# Patient Record
Sex: Female | Born: 1950 | Race: Black or African American | Hispanic: No | Marital: Married
Health system: Southern US, Community
[De-identification: ages and names within clinical notes are randomized; demographics above are authoritative.]

---

## 1998-06-06 ENCOUNTER — Ambulatory Visit (HOSPITAL_COMMUNITY): Admission: RE | Admit: 1998-06-06 | Discharge: 1998-06-06 | Payer: Self-pay | Admitting: *Deleted

## 1998-06-06 ENCOUNTER — Encounter: Payer: Self-pay | Admitting: *Deleted

## 1999-01-07 ENCOUNTER — Other Ambulatory Visit: Admission: RE | Admit: 1999-01-07 | Discharge: 1999-01-07 | Payer: Self-pay | Admitting: Obstetrics & Gynecology

## 1999-06-11 ENCOUNTER — Ambulatory Visit (HOSPITAL_COMMUNITY): Admission: RE | Admit: 1999-06-11 | Discharge: 1999-06-11 | Payer: Self-pay | Admitting: Obstetrics & Gynecology

## 1999-06-11 ENCOUNTER — Encounter: Payer: Self-pay | Admitting: Obstetrics & Gynecology

## 2000-01-22 ENCOUNTER — Ambulatory Visit (HOSPITAL_COMMUNITY): Admission: RE | Admit: 2000-01-22 | Discharge: 2000-01-22 | Payer: Self-pay | Admitting: Internal Medicine

## 2000-01-23 ENCOUNTER — Encounter: Payer: Self-pay | Admitting: Internal Medicine

## 2000-02-26 ENCOUNTER — Other Ambulatory Visit: Admission: RE | Admit: 2000-02-26 | Discharge: 2000-02-26 | Payer: Self-pay | Admitting: Obstetrics & Gynecology

## 2000-03-05 ENCOUNTER — Encounter: Payer: Self-pay | Admitting: Internal Medicine

## 2000-03-05 ENCOUNTER — Encounter (INDEPENDENT_AMBULATORY_CARE_PROVIDER_SITE_OTHER): Payer: Self-pay | Admitting: *Deleted

## 2000-03-05 ENCOUNTER — Ambulatory Visit (HOSPITAL_COMMUNITY): Admission: RE | Admit: 2000-03-05 | Discharge: 2000-03-05 | Payer: Self-pay | Admitting: Internal Medicine

## 2000-06-30 ENCOUNTER — Ambulatory Visit (HOSPITAL_COMMUNITY): Admission: RE | Admit: 2000-06-30 | Discharge: 2000-06-30 | Payer: Self-pay | Admitting: Obstetrics & Gynecology

## 2000-06-30 ENCOUNTER — Encounter: Payer: Self-pay | Admitting: Obstetrics & Gynecology

## 2000-07-07 ENCOUNTER — Encounter: Admission: RE | Admit: 2000-07-07 | Discharge: 2000-07-07 | Payer: Self-pay | Admitting: Obstetrics & Gynecology

## 2000-07-07 ENCOUNTER — Encounter: Payer: Self-pay | Admitting: Obstetrics & Gynecology

## 2001-03-01 ENCOUNTER — Other Ambulatory Visit: Admission: RE | Admit: 2001-03-01 | Discharge: 2001-03-01 | Payer: Self-pay | Admitting: Obstetrics & Gynecology

## 2001-03-10 ENCOUNTER — Encounter: Payer: Self-pay | Admitting: Obstetrics & Gynecology

## 2001-03-10 ENCOUNTER — Encounter: Admission: RE | Admit: 2001-03-10 | Discharge: 2001-03-10 | Payer: Self-pay | Admitting: Obstetrics & Gynecology

## 2001-08-02 ENCOUNTER — Encounter: Admission: RE | Admit: 2001-08-02 | Discharge: 2001-08-02 | Payer: Self-pay | Admitting: Urology

## 2001-08-02 ENCOUNTER — Encounter: Payer: Self-pay | Admitting: Urology

## 2002-03-15 ENCOUNTER — Other Ambulatory Visit: Admission: RE | Admit: 2002-03-15 | Discharge: 2002-03-15 | Payer: Self-pay | Admitting: Obstetrics & Gynecology

## 2002-03-15 ENCOUNTER — Encounter: Payer: Self-pay | Admitting: Obstetrics & Gynecology

## 2002-03-15 ENCOUNTER — Encounter: Admission: RE | Admit: 2002-03-15 | Discharge: 2002-03-15 | Payer: Self-pay | Admitting: Obstetrics & Gynecology

## 2002-05-26 ENCOUNTER — Ambulatory Visit (HOSPITAL_COMMUNITY): Admission: RE | Admit: 2002-05-26 | Discharge: 2002-05-26 | Payer: Self-pay | Admitting: Endocrinology

## 2002-05-26 ENCOUNTER — Encounter: Payer: Self-pay | Admitting: Endocrinology

## 2002-12-13 ENCOUNTER — Encounter: Payer: Self-pay | Admitting: Endocrinology

## 2002-12-13 ENCOUNTER — Ambulatory Visit (HOSPITAL_COMMUNITY): Admission: RE | Admit: 2002-12-13 | Discharge: 2002-12-13 | Payer: Self-pay | Admitting: Endocrinology

## 2003-03-20 ENCOUNTER — Encounter: Admission: RE | Admit: 2003-03-20 | Discharge: 2003-03-20 | Payer: Self-pay | Admitting: Obstetrics & Gynecology

## 2003-03-24 ENCOUNTER — Encounter: Admission: RE | Admit: 2003-03-24 | Discharge: 2003-03-24 | Payer: Self-pay | Admitting: Family Medicine

## 2003-06-16 ENCOUNTER — Ambulatory Visit (HOSPITAL_COMMUNITY): Admission: RE | Admit: 2003-06-16 | Discharge: 2003-06-16 | Payer: Self-pay | Admitting: Endocrinology

## 2003-09-21 ENCOUNTER — Encounter (INDEPENDENT_AMBULATORY_CARE_PROVIDER_SITE_OTHER): Payer: Self-pay | Admitting: Specialist

## 2003-09-21 ENCOUNTER — Ambulatory Visit (HOSPITAL_COMMUNITY): Admission: RE | Admit: 2003-09-21 | Discharge: 2003-09-21 | Payer: Self-pay | Admitting: Endocrinology

## 2003-10-26 ENCOUNTER — Ambulatory Visit (HOSPITAL_COMMUNITY): Admission: RE | Admit: 2003-10-26 | Discharge: 2003-10-26 | Payer: Self-pay | Admitting: Internal Medicine

## 2004-03-27 ENCOUNTER — Encounter: Admission: RE | Admit: 2004-03-27 | Discharge: 2004-03-27 | Payer: Self-pay | Admitting: Obstetrics & Gynecology

## 2004-04-29 ENCOUNTER — Ambulatory Visit (HOSPITAL_COMMUNITY): Admission: RE | Admit: 2004-04-29 | Discharge: 2004-04-29 | Payer: Self-pay | Admitting: General Surgery

## 2004-09-16 ENCOUNTER — Ambulatory Visit (HOSPITAL_COMMUNITY): Admission: RE | Admit: 2004-09-16 | Discharge: 2004-09-16 | Payer: Self-pay | Admitting: Endocrinology

## 2005-04-03 ENCOUNTER — Encounter: Admission: RE | Admit: 2005-04-03 | Discharge: 2005-04-03 | Payer: Self-pay | Admitting: Obstetrics & Gynecology

## 2005-05-02 ENCOUNTER — Ambulatory Visit (HOSPITAL_COMMUNITY): Admission: RE | Admit: 2005-05-02 | Discharge: 2005-05-02 | Payer: Self-pay | Admitting: Endocrinology

## 2005-05-23 ENCOUNTER — Inpatient Hospital Stay (HOSPITAL_COMMUNITY): Admission: EM | Admit: 2005-05-23 | Discharge: 2005-05-26 | Payer: Self-pay | Admitting: Emergency Medicine

## 2006-03-03 ENCOUNTER — Ambulatory Visit (HOSPITAL_COMMUNITY): Admission: RE | Admit: 2006-03-03 | Discharge: 2006-03-03 | Payer: Self-pay | Admitting: Endocrinology

## 2006-04-20 ENCOUNTER — Encounter: Admission: RE | Admit: 2006-04-20 | Discharge: 2006-04-20 | Payer: Self-pay | Admitting: Obstetrics & Gynecology

## 2007-04-05 ENCOUNTER — Ambulatory Visit (HOSPITAL_COMMUNITY): Admission: RE | Admit: 2007-04-05 | Discharge: 2007-04-05 | Payer: Self-pay | Admitting: Endocrinology

## 2007-04-27 ENCOUNTER — Encounter: Admission: RE | Admit: 2007-04-27 | Discharge: 2007-04-27 | Payer: Self-pay | Admitting: Obstetrics & Gynecology

## 2007-06-02 ENCOUNTER — Ambulatory Visit (HOSPITAL_COMMUNITY): Admission: RE | Admit: 2007-06-02 | Discharge: 2007-06-02 | Payer: Self-pay | Admitting: Endocrinology

## 2007-06-02 ENCOUNTER — Encounter (INDEPENDENT_AMBULATORY_CARE_PROVIDER_SITE_OTHER): Payer: Self-pay | Admitting: Diagnostic Radiology

## 2008-03-03 ENCOUNTER — Ambulatory Visit (HOSPITAL_COMMUNITY): Admission: RE | Admit: 2008-03-03 | Discharge: 2008-03-03 | Payer: Self-pay | Admitting: Oncology

## 2008-04-27 ENCOUNTER — Encounter: Admission: RE | Admit: 2008-04-27 | Discharge: 2008-04-27 | Payer: Self-pay | Admitting: Obstetrics & Gynecology

## 2008-09-12 ENCOUNTER — Ambulatory Visit (HOSPITAL_COMMUNITY): Admission: RE | Admit: 2008-09-12 | Discharge: 2008-09-12 | Payer: Self-pay | Admitting: Endocrinology

## 2009-05-01 ENCOUNTER — Encounter: Admission: RE | Admit: 2009-05-01 | Discharge: 2009-05-01 | Payer: Self-pay | Admitting: Obstetrics & Gynecology

## 2009-10-26 ENCOUNTER — Ambulatory Visit (HOSPITAL_COMMUNITY): Admission: RE | Admit: 2009-10-26 | Discharge: 2009-10-26 | Payer: Self-pay | Admitting: Endocrinology

## 2009-11-14 ENCOUNTER — Ambulatory Visit (HOSPITAL_COMMUNITY): Admission: RE | Admit: 2009-11-14 | Discharge: 2009-11-14 | Payer: Self-pay | Admitting: Internal Medicine

## 2010-04-28 ENCOUNTER — Encounter: Payer: Self-pay | Admitting: Endocrinology

## 2010-05-03 ENCOUNTER — Encounter
Admission: RE | Admit: 2010-05-03 | Discharge: 2010-05-03 | Payer: Self-pay | Source: Home / Self Care | Attending: Obstetrics & Gynecology | Admitting: Obstetrics & Gynecology

## 2010-07-22 ENCOUNTER — Ambulatory Visit
Admission: RE | Admit: 2010-07-22 | Discharge: 2010-07-22 | Disposition: A | Discharge status: Home / Self Care | Payer: BC Managed Care – PPO | Source: Ambulatory Visit | Attending: Family Medicine | Admitting: Family Medicine

## 2010-07-22 ENCOUNTER — Other Ambulatory Visit: Payer: Self-pay | Admitting: Family Medicine

## 2010-07-22 DIAGNOSIS — R52 Pain, unspecified: Secondary | ICD-10-CM

## 2010-08-23 NOTE — H&P (Signed)
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  In general, she is in good health. She reports that she is usually  physically active, trying to walk about 20 minutes each day.  Generally, she  has no trouble doing this.  She also notes that in July of 2006, she was  found to have evidence of hyperlipidemia. She was placed on Lipitor, but did  not take this medication after a few days because she thought it caused  arthralgias.  The patient indicates that yesterday she walked from one  building on campus to another.  She says that the distance was approximately  a half a mile.  Before she got to the other building, she began to  experience some substernal burning or aching chest discomfort which was  nonradiating and associated with mild dyspnea. She stopped and rested and  the pain seemed to resolve.  When she resumed walking, she said the pain  came back but was not as severe. There was no associated diaphoresis or  nausea.  When she arrived at her destination, she attended a meeting and had  no difficulty during that time.  Later in the day, she walked up a flight of  stairs and experienced recurrence of substernal burning which she described  as different that anything she had ever experienced before.  This morning,  she called Dr. Nash Ayala office and talked to [facility].  The [facility] told  her to come to the [facility] emergency room.  On arrival to [facility]  emergency room, her blood pressure was 157/91, pulse was 70,  O2 sat 100%.  Other information which is available so far includes an electrocardiogram  which shows normal sinus rhythm with no ischemia, arrhythmia or hypertrophy,  and a chest x-ray which is suggestive of borderline cardiomegaly.  The  patient is admitted for evaluation of exertional chest pain.   PAST MEDICAL HISTORY:   MEDICATIONS:  The only medication she is taking at present is Actonel.   ALLERGIES:  She is not allergic to any medications.   OPERATIONS:  1.  She had a TAH-BSO in the past.  2.  She also has had a biopsy of a thyroid nodule. This revealed only benign      tissue.   MEDICAL ILLNESSES:  1.  Hyperlipidemia. Please see above.  2.  Thyroid nodule. The patient has been followed on an ongoing basis for      evaluation of a thyroid nodule.  On the last CAT scan that was obtained,      this nodule has not enlarged in  size.  I believe the plan is going to be      to continue to follow it expectantly.   FAMILY HISTORY:  Her mother died of a stroke at age 18. She really does not  know too much about the details of this illness.  Her father died apparently  of old age at age 49. She does not have any siblings. She also does not have  any children.   SOCIAL HISTORY:  She is married.  Her husband has diabetes.  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 She discontinued cigarette smoking in 1983.  She does  not abuse alcohol or drugs.   REVIEW OF SYSTEMS:  HEAD:  She denies headache or dizziness. EYES:  She  denies visual blurring or diplopia.  EARS, NOSE, THROAT:  Denies earache,  sinus pain or sore throat. CHEST:  Denies coughing or wheezing.  CARDIOVASCULAR:  See above. There has been no orthopnea or PND. Note that  the patient was evaluated by [facility] in about 2003 with a Cardiolite  study that was normal. GI: She denies nausea or vomiting. She did have some  acid reflux symptoms when she first  started Actonel, but she has not had any  problems like this for many months. There has been no hematemesis or melena.  GU: She denies dysuria or urinary frequency. NEUROLOGIC:  There is no  history of seizure or stroke.  ENDOCRINE:  Denies excessive thirst, urinary  frequency or nocturia.   PHYSICAL EXAMINATION:  GENERAL:  She is a very pleasant cooperative lady.  VITAL SIGNS:  Temperature is 97.2, blood pressure initially was 157/91,  pulse 70, respirations 18, O2 saturation was 100%. HEENT:  Pupils were equal  and reactive. Tympanic membranes were clear. Nares were patent. Pharynx is  without erythema or exudate.  NECK:  A thyroid nodule was easily palpable.  It is smooth, somewhat soft.  It is nontender. It is perhaps 1.5 cm in diameter.  CHEST:  Clear.  BACK:  No CVA or point tenderness.  CARDIOVASCULAR:  Normal S1 and S2.  There are no rubs, murmurs or gallops.  ABDOMEN:  Benign.  NEUROLOGIC:  Neurologic testing and examination of the extremities was  normal.   IMPRESSION:  1.  Exertional chest pain; consider cardiac etiology.  2.  Osteoporosis.  3.  History of hyperlipidemia.  4.  Family history of stroke affecting mother at age 66.  5.  Thyroid nodule felt to be benign.  6.  Status post hysterectomy.   PLAN:  Will admit the patient to rule out MI.  Will obtain a lipid profile  to check the status of this problem.  A cardiology consult will be  requested.           ______________________________  Sherin Quarry, MD     SY/MEDQ  D:  05/23/2005  T:  05/23/2005  Job:  [id number]   cc:   [facility], M.D.  Fax: [phone]

## 2010-08-23 NOTE — Discharge Summary (Signed)
NAME:  Joy [facility]ala, [facility]         ACCOUNT NO.:  [id number]   MEDICAL RECORD NO.:  [id number]          PATIENT TYPE:  INP   LOCATION:  1402                         FACILITY:  [facility]   PHYSICIAN:  Hollice Espy, M.D.DATE OF BIRTH:  07/07/1950   DATE OF ADMISSION:  05/23/2005  DATE OF DISCHARGE:  05/26/2005                                 DISCHARGE SUMMARY   CONSULTATIONS:  1.  Viann Fish, M.D., cardiology.  2.  Graylin Shiver, M.D., [facility].   PRIMARY CARE PHYSICIAN:  Francis P. Modesto Charon, M.D.   DISCHARGE DIAGNOSES:  1.  A 7 mm duodenal ulcer.  2.  Upper gastrointestinal bleed, now resolved.  3.  Status post EGD done February 17.  4.  Anemia secondary to GI bleed status post blood transfusion.   DISCHARGE MEDICATIONS:  The patient will continue her previous medication of  Boniva. She will be started on Protonix 40 mg p.o. b.i.d. x30 days and then  daily.   FOLLOW UP:  The patient will follow up with Dr. Modesto Charon in the next 7 days. At  that time, followup on her H. pylori test will be done.   DIET:  Discharge diet is regular diet.   ACTIVITY:  As tolerated.   DISPOSITION:  Improved.   HISTORY OF PRESENT ILLNESS:  The patient is a 60 year old African-American  female with essentially no past medical history who presented with  complaints of weakness, chest burning and discomfort on May 23, 2005.  Initially she was evaluated for chest pain rule out cardiac etiology  however, on admission she was noted to have a hemoglobin of 8.4. She had no  previous history of anemia. Overnight her hemoglobin dropped to 7.5 and she  had a melanotic stool episode. At that time, it was suspected that perhaps  the patient had a bleeding ulcer. Dr. Donnie Aho from cardiology evaluated the  patient and likely suspected that she had an upper GI bleed and likely an  ulceration may be cause of this  mid epigastric discomfort. It is possible  that with a low enough hemoglobin, this could be  leading to a cardiac  etiology as well. The patient was transfused 2 units and [facility] was  consulted. Dr. Evette Cristal saw the patient and performed an upper endoscopy on the  patient showing a 7 mm duodenal ulcer. No visible vessel. The patient had  been started on IV PPI, this was continued and she has continued to be  observed. By May 26, 2004, she was doing well, tolerating p.o. She had  a brief episode of melanotic stool on February 18 but by February 19, her  hemoglobin had increased to 11.4, she had no further episodes of GI bleeding  and it is felt to be stable for her to be  discharged home on a PPI. An H. pylori blood test was ordered and the plan  will be for the patient to followup with Dr. Modesto Charon and have the blood test  checked at that time. In the meantime, she is avoid aspirin and all types of  NSAIDs. The patient understands these things and we discussed this in  full  detail.      Hollice Espy, M.D.  Electronically Signed     SKK/MEDQ  D:  05/26/2005  T:  05/26/2005  Job:  [id number]   cc:   Graylin Shiver, M.D.  Fax: [phone]   Maryla Morrow. Modesto Charon, M.D.  Fax: [phone]   W. Viann Fish, M.D.  Fax: [phone]  Email: [email or web address]

## 2010-08-23 NOTE — H&P (Signed)
NAME:  Joy Ayala, [name]         ACCOUNT NO.:  [id number]   MEDICAL RECORD NO.:  [id number]          PATIENT TYPE:  INP   LOCATION:  1402                         FACILITY:  [facility]   PHYSICIAN:  Sherin Quarry, MD      DATE OF BIRTH:  09-02-1950   DATE OF ADMISSION:  05/23/2005  DATE OF DISCHARGE:                                HISTORY & PHYSICAL   ADDENDUM:  Subsequent to my previous dictation, it was reported that the  patient's CBC shows a white count of 7,400, hemoglobin of 8.4 with  normochromic normocytic indices and a platelet count 203,000. I discussed  this with the patient. She indicates that she thinks that she last had a CBC  drawn in November of last year and that it was apparently normal. She has  had no history of hematemesis, melena or bleeding. She takes a multiple  vitamin, fish oil and garlic every day as well as her Actonel. There is no  previous history of anemia.   With [name]'s history of chest pain, I advised her that we should  draw appropriate lab studies and then give her 2 units of packed cells,  keeping her hemoglobin greater than 9 in order to ensure adequate oxygen-  carrying capacity. After a careful discussion, she categorically and  emphatically refuses blood transfusion. Therefore, I will obtain a  reticulocyte count, ferritin, iron TIBC, folate, B12 and heme stools x2.  Will follow her CBC and discuss possible transfusion if it continues to  drop. I believe the patient understands this plan.           ______________________________  Sherin Quarry, MD     SY/MEDQ  D:  05/23/2005  T:  05/24/2005  Job:  [id number]

## 2010-08-23 NOTE — Op Note (Signed)
NAME:  Joy Ayala, Joy Ayala         ACCOUNT NO.:  [id number]   MEDICAL RECORD NO.:  [id number]          PATIENT TYPE:  INP   LOCATION:  1402                         FACILITY:  [facility]   PHYSICIAN:  Graylin Shiver, M.D.   DATE OF BIRTH:  06-01-1950   DATE OF PROCEDURE:  05/25/2005  DATE OF DISCHARGE:                                 OPERATIVE REPORT   PROCEDURE:  Esophagogastroduodenoscopy.   INDICATIONS FOR PROCEDURE:  Melena, anemia.   Informed consent was obtained after explanation of the risks of bleeding,  infection and perforation.   PREMEDICATION:  Fentanyl 75 mcg IV, Versed 6 mg IV.   DESCRIPTION OF PROCEDURE:  With the patient in the left lateral decubitus  position, the Olympus gastroscope was inserted into the oropharynx and  passed into the esophagus. It was advanced down the esophagus and then into  the stomach and into the duodenum. The second portion of the duodenum looked  normal. In the distal duodenal bulb, the lumen looked scarred and somewhat  narrowed down. The area was also friable. I washed this area looking for a  specific lesion and did see a 7-mm duodenal ulcer. There was no obvious  visible vessel. There was friability however of the surrounding ulcer area  and scarred duodenal area. There was no evidence of tumor. The stomach  looked normal in its entirety. The esophagus looked normal in its entirety.  She tolerated the procedure well without complications.   IMPRESSION:  7-mm duodenal bulb ulcer with a scar deformed friable distal  bulb.   PLAN:  Protonix will be the treatment while here in the hospital. I would  recommend avoiding aspirin, nonsteroidal anti-inflammatory drugs and Lovenox  at this time. We will check a H. pylori antibody blood test.           ______________________________  Graylin Shiver, M.D.     SFG/MEDQ  D:  05/25/2005  T:  05/26/2005  Job:  [id number]   cc:   Sherin Quarry, MD

## 2010-08-23 NOTE — Consult Note (Signed)
NAME:  POET, HINEMAN         ACCOUNT NO.:  [id number]   MEDICAL RECORD NO.:  [id number]          PATIENT TYPE:  INP   LOCATION:  1402                         FACILITY:  [facility]   PHYSICIAN:  Meade Maw, M.D.    DATE OF BIRTH:  16-Jun-1950   DATE OF CONSULTATION:  DATE OF DISCHARGE:                                   CONSULTATION   INDICATION FOR CONSULTATION:  Chest pain.   HISTORY:  Joy [facility]ala is a very pleasant 60 year old female who I  originally evaluated in January, 2005 for chest pain.  At this time, she had  been noting ongoing epigastric chest pain for approximately three months.  The pain persisted for 10-20 minutes and was relieved with burping.  She  attributed the chest pain to Fosamax at that time.  She underwent a stress  Cardiolite.  The patient exercised for a total of six minutes.  There was no  evidence of inadequate coronary flow reserve.  She was noted to have normal  wall motion with an ejection fraction of 90%.  The patient has been doing  well subsequently.  She has been walking up and down the stairs and walking  10 minutes to her car.  She has had no chest pain with these activities.  Today, she noted a burning sensation in her chest, which was different from  her usual reflux pain.  The pain persisted for approximately 15 minutes.  The patient reached a place where she wanted to stop and rest, and the pain  gradually resolved.  There was no associated nausea, vomiting, or  diaphoresis with the chest pain.  Her coronary risk factors are significant  for age, remote history of tobacco use, stopped smoking now for 25 years  prior, dyslipidemia, no real family history.   PAST MEDICAL HISTORY:  1.  Osteoporosis.  2.  Thyroid nodule.  3.  Dyslipidemia.   PAST SURGICAL HISTORY:  Total abdominal hysterectomy with bilateral salpingo-  oophorectomy.   CURRENT MEDICATIONS:  Boniva.  She has been started on Lopressor 25 mg p.o.  q.12h.  Tylenol p.r.n.   Sublingual nitroglycerin.  Ativan p.r.n.  Ambien  p.r.n.  Pepcid 20 mg p.o. daily, per Dr. Tresa Endo.  She has also been started  on Lovenox with pharmacy to dose as well as aspirin.   She has no known drug allergies.   SOCIAL HISTORY:  She has recently married.  She works as a Production designer, theatre/television/film.  No  history of tobacco, alcohol, or illicit drug use.   FAMILY HISTORY:  Significant for hypertension.  Father's health history is  unknown.  Mother passed from alcohol abuse.   REVIEW OF SYSTEMS:  Otherwise negative.  No history of palpitations, tachy  arrhythmia, orthopnea, pedal edema.   PHYSICAL EXAMINATION:  VITAL SIGNS:  Blood pressure 157/91, heart rate 70.  Her respiratory rate is 18.  Her O2 sat is 100% on room air.  HEENT:  Unremarkable.  NECK:  She has good carotid upstrokes.  No carotid bruits are noted.  No  neck vein distention is noted.  RESPIRATORY:  Breath sounds which are clear and equal to auscultation.  No  use of accessory muscles.  CARDIOVASCULAR:  Regular rate and rhythm.  Normal S1.  Normal S2.  No rubs,  murmurs, or gallops noted.  ABDOMEN:  Benign.  Nontender.  EXTREMITIES:  Distal pulses which are palpable.  SKIN:  Warm and dry.  NEUROLOGIC:  Nonfocal.   LABORATORY DATA:  White count 7.4, hemoglobin 8.4, hematocrit 24.2.  Platelet count 203,000.  Sodium 139, potassium 3.9, CO2 26, BUN 11,  creatinine 0.8.   Chest x-ray reveals borderline cardiomegaly.  No acute disease.   ECG reveals a sinus rhythm.  There are borderline Q waves in the inferior  leads, which is new when compared with her ECG in 2005.  There are otherwise  nonspecific ST/T changes noted.   IMPRESSION:  77.  A 60 year old female with chest pain atypical for cardiac.  Her initial      point-of-care markers are negative.  The patient has  risk factors,      including age, dyslipidemia, hypertension, and questionable new Q waves      on her inferior leads.  We will therefore schedule the patient for       inpatient stress Cardiolite.  Agree with therapy as you have now with      Lopressor, Lovenox.  Will hold the Lopressor prior to her stress test as      well as the aspirin.  2.  Anemia:  This is a new problem.  I will defer it to her primary care      doctor for further evaluation and treatment.  3.  Dyslipidemia:  Agree with obtaining a lipid profile.  If LDL is more      than 130, she should be started on an anticholesterol medication.      Meade Maw, M.D.  Electronically Signed     HP/MEDQ  D:  05/23/2005  T:  05/23/2005  Job:  [id number]   cc:   Thelma Barge P. Modesto Charon, M.D.  Fax: [phone]

## 2010-08-23 NOTE — Consult Note (Signed)
NAME:  Joy [facility]ala, Joy Ayala         ACCOUNT NO.:  [id number]   MEDICAL RECORD NO.:  [id number]          PATIENT TYPE:  INP   LOCATION:  1402                         [facility]ACILITY:  [facility]   PHYSICIAN:  Graylin Shiver, M.D.   DATE OF BIRTH:  1951-03-01   DATE OF CONSULTATION:  05/24/2005  DATE OF DISCHARGE:                                   CONSULTATION   REASON FOR CONSULTATION:  The patient is a 60 year old female who was  admitted to the hospital on May 23, 2005 by Dr. Tresa Endo after the  patient presented to the emergency room with exertional chest pain. The  patient was seen in consultation by cardiology.   It was discovered that the patient was anemic and today the patient  experienced a melanotic stool. The patient has been having some heartburn  symptoms recently. She gives no history of vomiting, no history of peptic  ulcer disease. Her appetite has been good, her weight has been stable.   Her hemoglobin today was 7.5 and hematocrit 21. The melanotic stool was  checked and was Hemoccult positive. The patient denies taking aspirin. She  does take Actonel and Boniva.   PAST MEDICAL HISTORY:  Allergies, none known.   PAST SURGICAL HISTORY:  Abdominal hysterectomy, salpingo-oophorectomy,  biopsy of thyroid nodule.   MEDICAL PROBLEMS:  Hyperlipidemia, thyroid nodule.   FAMILY HISTORY:  Negative for colon cancer or colon polyps.   SOCIAL HISTORY:  Does not smoke cigarettes, does not drink excessive  alcohol.   REVIEW OF SYSTEMS:  No additional complaints other than above.   PHYSICAL EXAMINATION:  GENERAL:  She is in no distress.  VITAL SIGNS:  Stable.  NECK:  Supple.  HEART:  Regular rhythm, no murmurs.  LUNGS:  Clear.  ABDOMEN:  Soft, nontender, no hepatosplenomegaly.   IMPRESSION:  1.  Chest pain most likely caused by anemia.  2.  Anemia.  3.  Melena.  4.  Heartburn.   PLAN:  Will proceed with EGD to evaluate the upper GI tract and look for any  evidence of an  ulcer or significant other abnormalities in the upper GI  tract. The patient will be on Protonix. We will hold the Lovenox at this  time. She is scheduled to have blood transfusion.           ______________________________  Graylin Shiver, M.D.     SFG/MEDQ  D:  05/24/2005  T:  05/24/2005  Job:  [id number]   cc:   Sherin Quarry, MD   Maryla Morrow. Modesto Charon, M.D.  Fax: [phone]   [facility]

## 2010-10-02 ENCOUNTER — Other Ambulatory Visit: Payer: Self-pay | Admitting: Endocrinology

## 2010-10-02 DIAGNOSIS — E049 Nontoxic goiter, unspecified: Secondary | ICD-10-CM

## 2010-10-10 ENCOUNTER — Ambulatory Visit
Admission: RE | Admit: 2010-10-10 | Discharge: 2010-10-10 | Disposition: A | Discharge status: Home / Self Care | Payer: BC Managed Care – PPO | Source: Ambulatory Visit | Attending: Endocrinology | Admitting: Endocrinology

## 2010-10-10 DIAGNOSIS — E049 Nontoxic goiter, unspecified: Secondary | ICD-10-CM

## 2010-10-16 ENCOUNTER — Other Ambulatory Visit: Payer: Self-pay | Admitting: Endocrinology

## 2010-10-16 DIAGNOSIS — E049 Nontoxic goiter, unspecified: Secondary | ICD-10-CM

## 2010-12-27 LAB — CBC
Hemoglobin: 13.4
MCHC: 34.5
MCV: 90.4
RBC: 4.31

## 2011-04-14 ENCOUNTER — Ambulatory Visit
Admission: RE | Admit: 2011-04-14 | Discharge: 2011-04-14 | Disposition: A | Discharge status: Home / Self Care | Payer: BC Managed Care – PPO | Source: Ambulatory Visit | Attending: Endocrinology | Admitting: Endocrinology

## 2011-04-14 DIAGNOSIS — E049 Nontoxic goiter, unspecified: Secondary | ICD-10-CM

## 2011-05-06 ENCOUNTER — Other Ambulatory Visit: Payer: Self-pay | Admitting: Obstetrics & Gynecology

## 2011-05-06 DIAGNOSIS — Z1231 Encounter for screening mammogram for malignant neoplasm of breast: Secondary | ICD-10-CM

## 2011-05-16 ENCOUNTER — Ambulatory Visit
Admission: RE | Admit: 2011-05-16 | Discharge: 2011-05-16 | Disposition: A | Discharge status: Home / Self Care | Payer: BC Managed Care – PPO | Source: Ambulatory Visit | Attending: Obstetrics & Gynecology | Admitting: Obstetrics & Gynecology

## 2011-05-16 ENCOUNTER — Ambulatory Visit: Payer: BC Managed Care – PPO

## 2011-05-16 DIAGNOSIS — Z1231 Encounter for screening mammogram for malignant neoplasm of breast: Secondary | ICD-10-CM

## 2011-10-07 ENCOUNTER — Other Ambulatory Visit: Payer: Self-pay | Admitting: Endocrinology

## 2011-10-07 DIAGNOSIS — E049 Nontoxic goiter, unspecified: Secondary | ICD-10-CM

## 2011-10-16 ENCOUNTER — Ambulatory Visit
Admission: RE | Admit: 2011-10-16 | Discharge: 2011-10-16 | Disposition: A | Discharge status: Home / Self Care | Payer: BC Managed Care – PPO | Source: Ambulatory Visit | Attending: Endocrinology | Admitting: Endocrinology

## 2011-10-16 DIAGNOSIS — E049 Nontoxic goiter, unspecified: Secondary | ICD-10-CM

## 2012-04-30 ENCOUNTER — Other Ambulatory Visit: Payer: Self-pay | Admitting: Obstetrics & Gynecology

## 2012-04-30 DIAGNOSIS — Z1231 Encounter for screening mammogram for malignant neoplasm of breast: Secondary | ICD-10-CM

## 2012-06-01 ENCOUNTER — Ambulatory Visit
Admission: RE | Admit: 2012-06-01 | Discharge: 2012-06-01 | Disposition: A | Discharge status: Home / Self Care | Payer: BC Managed Care – PPO | Source: Ambulatory Visit | Attending: Obstetrics & Gynecology | Admitting: Obstetrics & Gynecology

## 2012-06-01 DIAGNOSIS — Z1231 Encounter for screening mammogram for malignant neoplasm of breast: Secondary | ICD-10-CM

## 2012-08-10 ENCOUNTER — Other Ambulatory Visit: Payer: Self-pay | Admitting: Family Medicine

## 2012-08-10 DIAGNOSIS — M542 Cervicalgia: Secondary | ICD-10-CM

## 2012-08-14 ENCOUNTER — Ambulatory Visit
Admission: RE | Admit: 2012-08-14 | Discharge: 2012-08-14 | Disposition: A | Discharge status: Home / Self Care | Payer: BC Managed Care – PPO | Source: Ambulatory Visit | Attending: Family Medicine | Admitting: Family Medicine

## 2012-08-14 DIAGNOSIS — M542 Cervicalgia: Secondary | ICD-10-CM

## 2013-01-03 ENCOUNTER — Other Ambulatory Visit: Payer: Self-pay | Admitting: Endocrinology

## 2013-01-03 DIAGNOSIS — E049 Nontoxic goiter, unspecified: Secondary | ICD-10-CM

## 2013-04-27 ENCOUNTER — Other Ambulatory Visit: Payer: Self-pay

## 2013-04-27 DIAGNOSIS — Z1231 Encounter for screening mammogram for malignant neoplasm of breast: Secondary | ICD-10-CM

## 2013-06-02 ENCOUNTER — Ambulatory Visit: Payer: BC Managed Care – PPO

## 2013-06-06 ENCOUNTER — Ambulatory Visit
Admission: RE | Admit: 2013-06-06 | Discharge: 2013-06-06 | Disposition: A | Discharge status: Home / Self Care | Payer: BC Managed Care – PPO | Source: Ambulatory Visit

## 2013-06-06 DIAGNOSIS — Z1231 Encounter for screening mammogram for malignant neoplasm of breast: Secondary | ICD-10-CM

## 2013-09-15 ENCOUNTER — Ambulatory Visit
Admission: RE | Admit: 2013-09-15 | Discharge: 2013-09-15 | Disposition: A | Discharge status: Home / Self Care | Payer: BC Managed Care – PPO | Source: Ambulatory Visit | Attending: Endocrinology | Admitting: Endocrinology

## 2013-09-15 DIAGNOSIS — E049 Nontoxic goiter, unspecified: Secondary | ICD-10-CM

## 2013-09-16 ENCOUNTER — Other Ambulatory Visit: Payer: BC Managed Care – PPO

## 2013-09-29 ENCOUNTER — Other Ambulatory Visit: Payer: BC Managed Care – PPO

## 2015-08-20 IMAGING — US US SOFT TISSUE HEAD/NECK
1 series · 13 of 25 positions shown · non-contrast
Comparison: Most recent prior thyroid ultrasound 10/16/2011

CLINICAL DATA: Thyroid goiter. Isthmic nodule previously biopsied
in Monday May, 2006 and Thursday November, 2009

EXAM:
THYROID ULTRASOUND
TECHNIQUE: Ultrasound examination of the thyroid gland and adjacent soft
tissues was performed.

[Series 1: us soft tissue head/neck · 0.06mm/px · 13 of 58 slices shown]
[im 1/58]
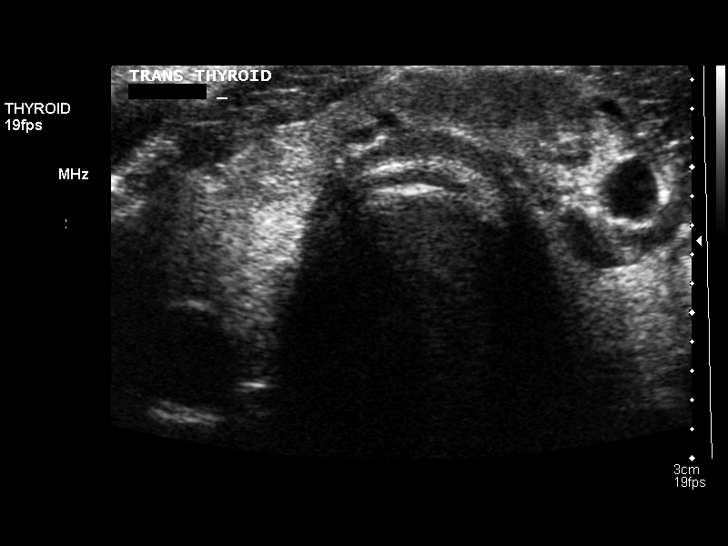
[im 5/58]
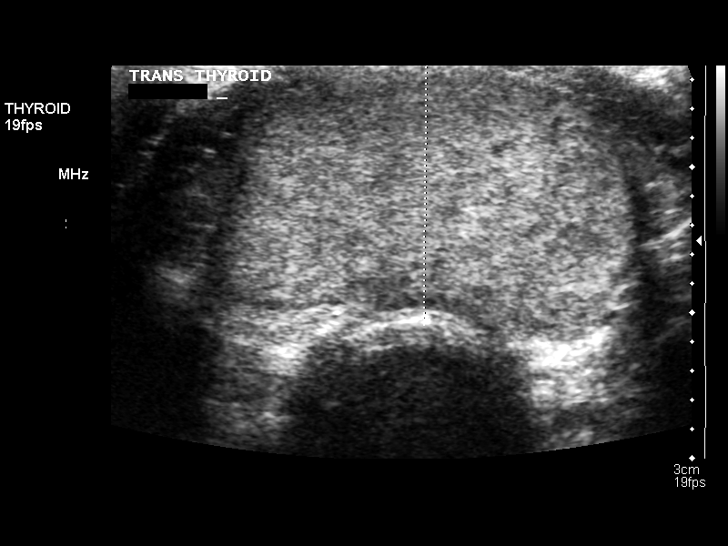
[im 10/58]
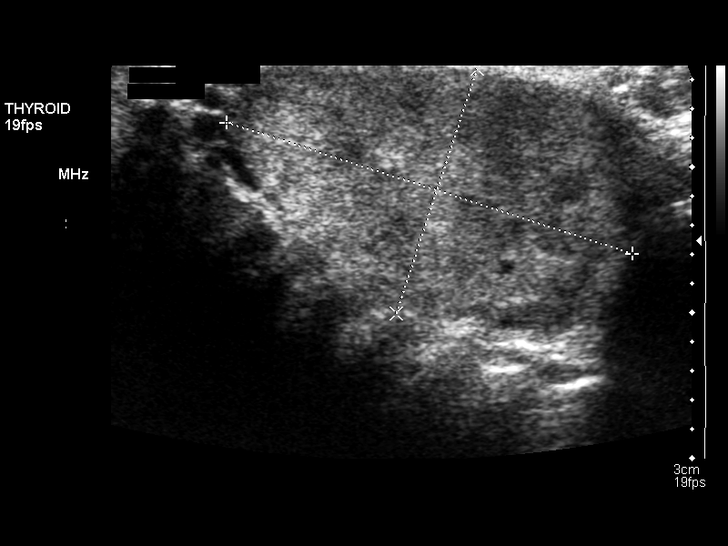
[im 15/58]
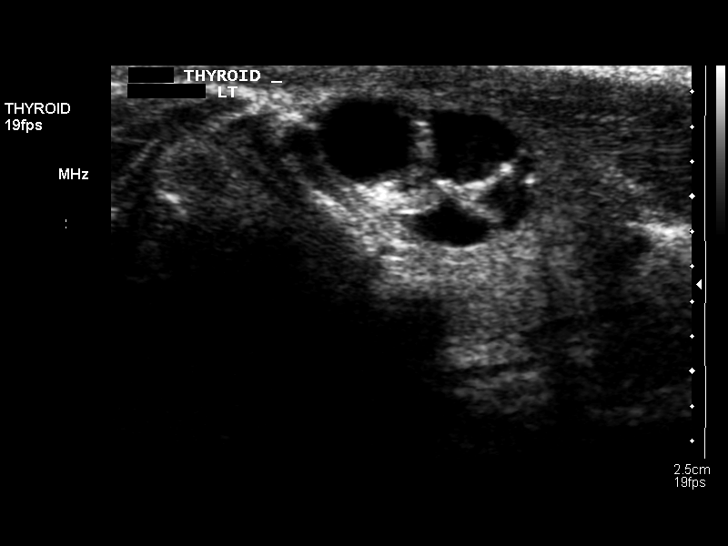
[im 20/58]
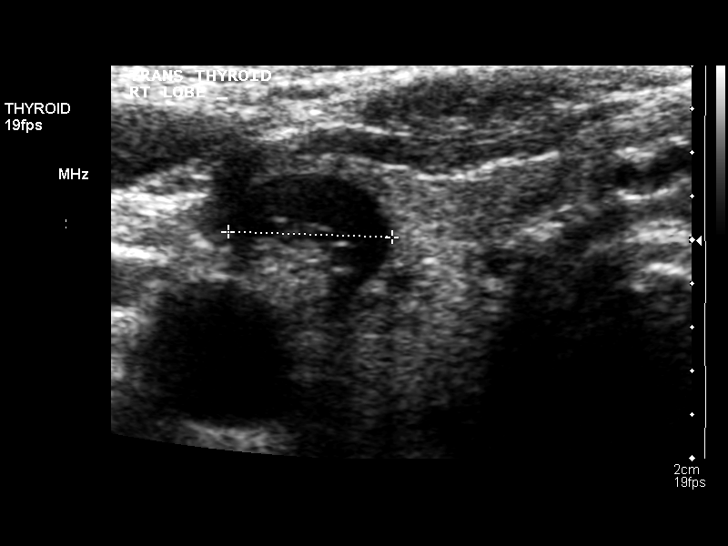
[im 24/58]
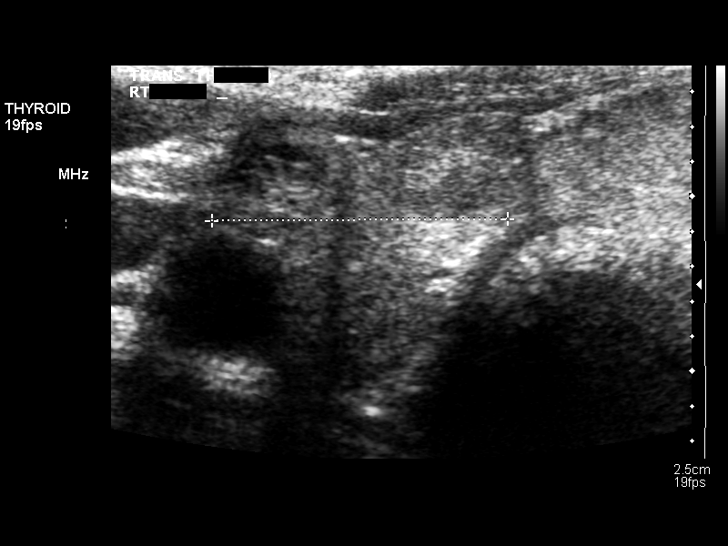
[im 29/58]
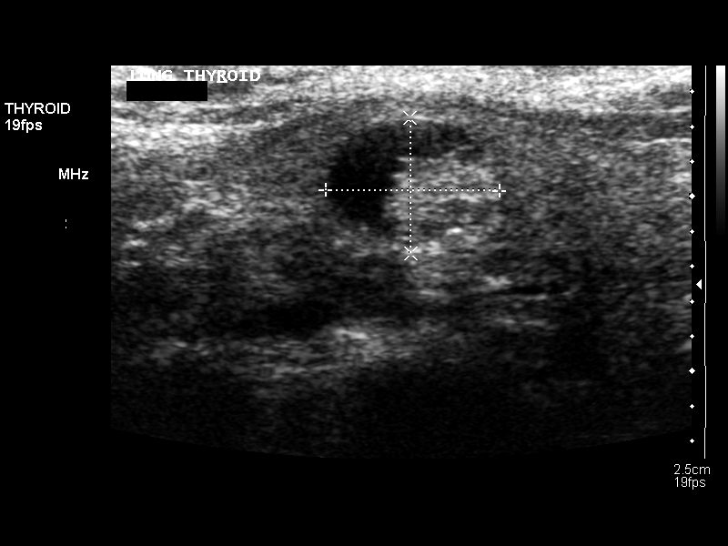
[im 34/58]
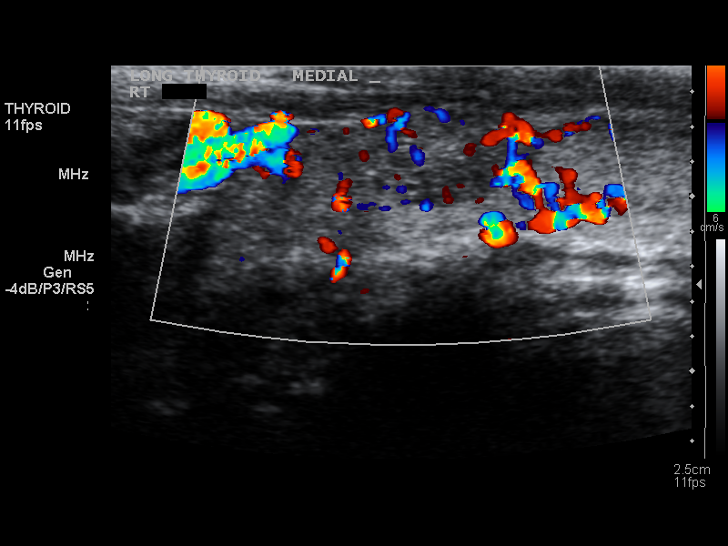
[im 39/58]
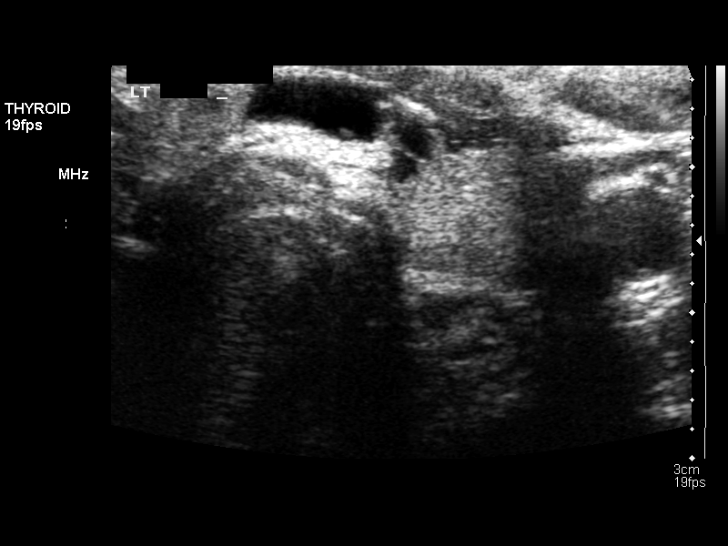
[im 43/58]
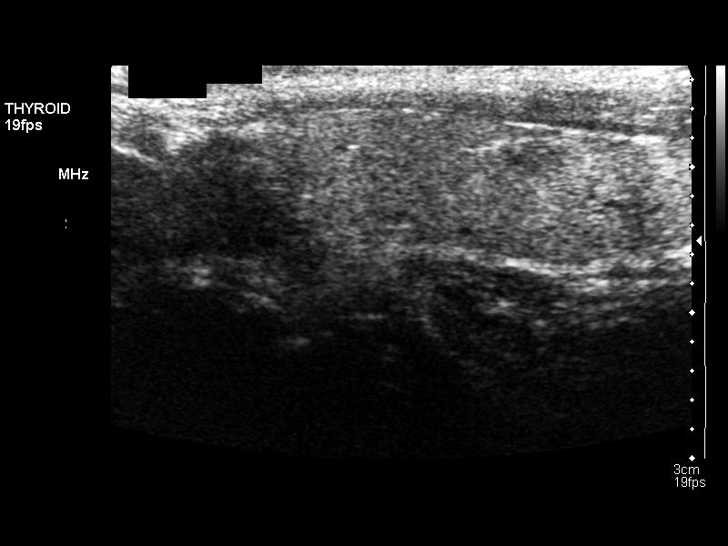
[im 48/58]
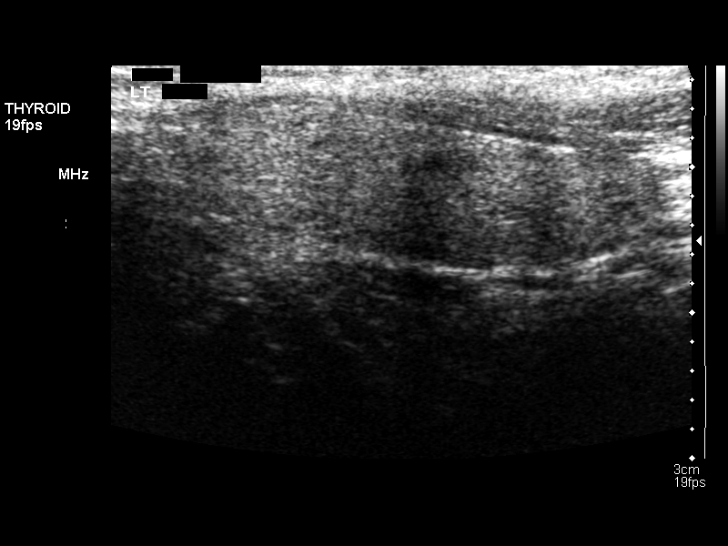
[im 53/58]
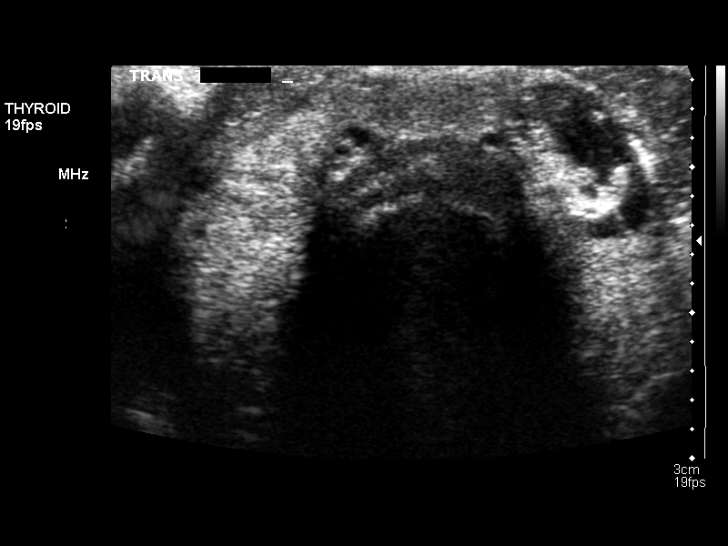
[im 58/58]
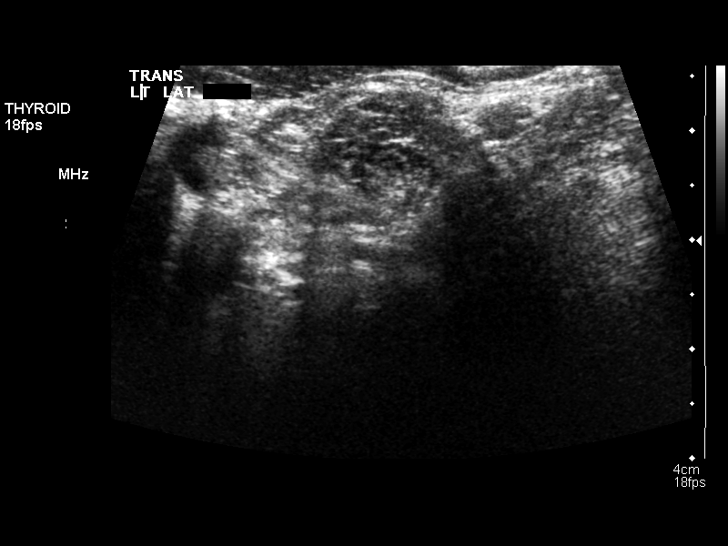

[13 of 25 positions shown; findings below may reference images not displayed]

FINDINGS: Right thyroid lobe

Measurements: 3.4 x 1.2 x 1.7 cm (previously 3.5 x 0.9 x 1.7 cm). No
significant interval change in the appearance of a complex cystic
nodule in the superior aspect of the gland which measures 1.0 x
x 0.8 cm (previously 1.1 x 0.6 x 0.6 cm). A solid sub cm (0.9 x
x 0.8 cm) nodule is again identified in the mid aspect of the gland
abutting the thymus and remains essentially unchanged compared to
previously when it measured 0.9 x 0.6 x 0.7 cm.

Left thyroid lobe

Measurements: 3.5 x 0.9 x 1.2 cm (previously 3.6 x 1.0 x 1.3 cm). No
significant interval change in the appearance of a small hypoechoic
nodules measuring less than 4 mm in diameter.

Isthmus

Thickness: 1.8 cm. Dominant solid nodule in the mid aspect of the
isthmus measures 2.8 x 1.8 x 2.9 cm. There has been no interval
change compared to prior when the same nodule measured 3.0 x 2.0 x
2.8 cm. An abutting complex cyst is essentially stable at 1.3 x
x 1.4 cm (previously 1.2 x 0.6 x 1.2 cm). The incremental interval
increase in size is secondary to increased cystic degeneration.

Lymphadenopathy

None visualized.
IMPRESSION: Continued stability of multiple bilateral thyroid nodules including
the previously biopsied dominant isthmic nodule.

Findings do not meet current SRU consensus criteria for biopsy.
Follow-up by clinical exam is recommended. If patient has known risk
factors for thyroid carcinoma, consider follow-up ultrasound in 12
months. If patient is clinically hyperthyroid, consider nuclear
medicine thyroid uptake and scan.Reference: Management of Thyroid
Nodules Detected at US: Society of Radiologists in Ultrasound
